# Patient Record
Sex: Male | Born: 1985 | Race: Black or African American | Hispanic: No | Marital: Single | Smoking: Never smoker
Health system: Southern US, Community
[De-identification: ages and names within clinical notes are randomized; demographics above are authoritative.]

## PROBLEM LIST (undated history)

## (undated) HISTORY — PX: OTHER SURGICAL HISTORY: SHX169

---
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
## 2007-09-05 ENCOUNTER — Emergency Department (HOSPITAL_COMMUNITY): Admission: EM | Admit: 2007-09-05 | Discharge: 2007-09-05 | Payer: Self-pay | Admitting: Emergency Medicine

## 2007-09-17 ENCOUNTER — Emergency Department (HOSPITAL_COMMUNITY): Admission: EM | Admit: 2007-09-17 | Discharge: 2007-09-17 | Payer: Self-pay | Admitting: Emergency Medicine

## 2007-09-25 ENCOUNTER — Emergency Department (HOSPITAL_COMMUNITY): Admission: EM | Admit: 2007-09-25 | Discharge: 2007-09-25 | Payer: Self-pay | Admitting: Emergency Medicine

## 2008-03-30 ENCOUNTER — Emergency Department: Payer: Self-pay | Admitting: Emergency Medicine

## 2010-12-02 ENCOUNTER — Emergency Department (HOSPITAL_COMMUNITY)
Admission: EM | Admit: 2010-12-02 | Discharge: 2010-12-02 | Disposition: A | Discharge status: Home / Self Care | Payer: Managed Care, Other (non HMO) | Attending: Emergency Medicine | Admitting: Emergency Medicine

## 2010-12-02 ENCOUNTER — Emergency Department (HOSPITAL_COMMUNITY): Payer: Managed Care, Other (non HMO)

## 2010-12-02 DIAGNOSIS — Y9239 Other specified sports and athletic area as the place of occurrence of the external cause: Secondary | ICD-10-CM | POA: Insufficient documentation

## 2010-12-02 DIAGNOSIS — W1809XA Striking against other object with subsequent fall, initial encounter: Secondary | ICD-10-CM | POA: Insufficient documentation

## 2010-12-02 DIAGNOSIS — Y9367 Activity, basketball: Secondary | ICD-10-CM | POA: Insufficient documentation

## 2010-12-02 DIAGNOSIS — S63509A Unspecified sprain of unspecified wrist, initial encounter: Secondary | ICD-10-CM | POA: Insufficient documentation

## 2012-07-03 IMAGING — CR DG WRIST COMPLETE 3+V*R*
4 series · 4 of 4 positions shown · non-contrast
Comparison: None.

CLINICAL DATA: Trauma.  Fall.  Pain.  Injured right wrist while
playing basketball 2 weeks ago.

RIGHT WRIST - COMPLETE 3+ VIEW

[x wrist pa right]
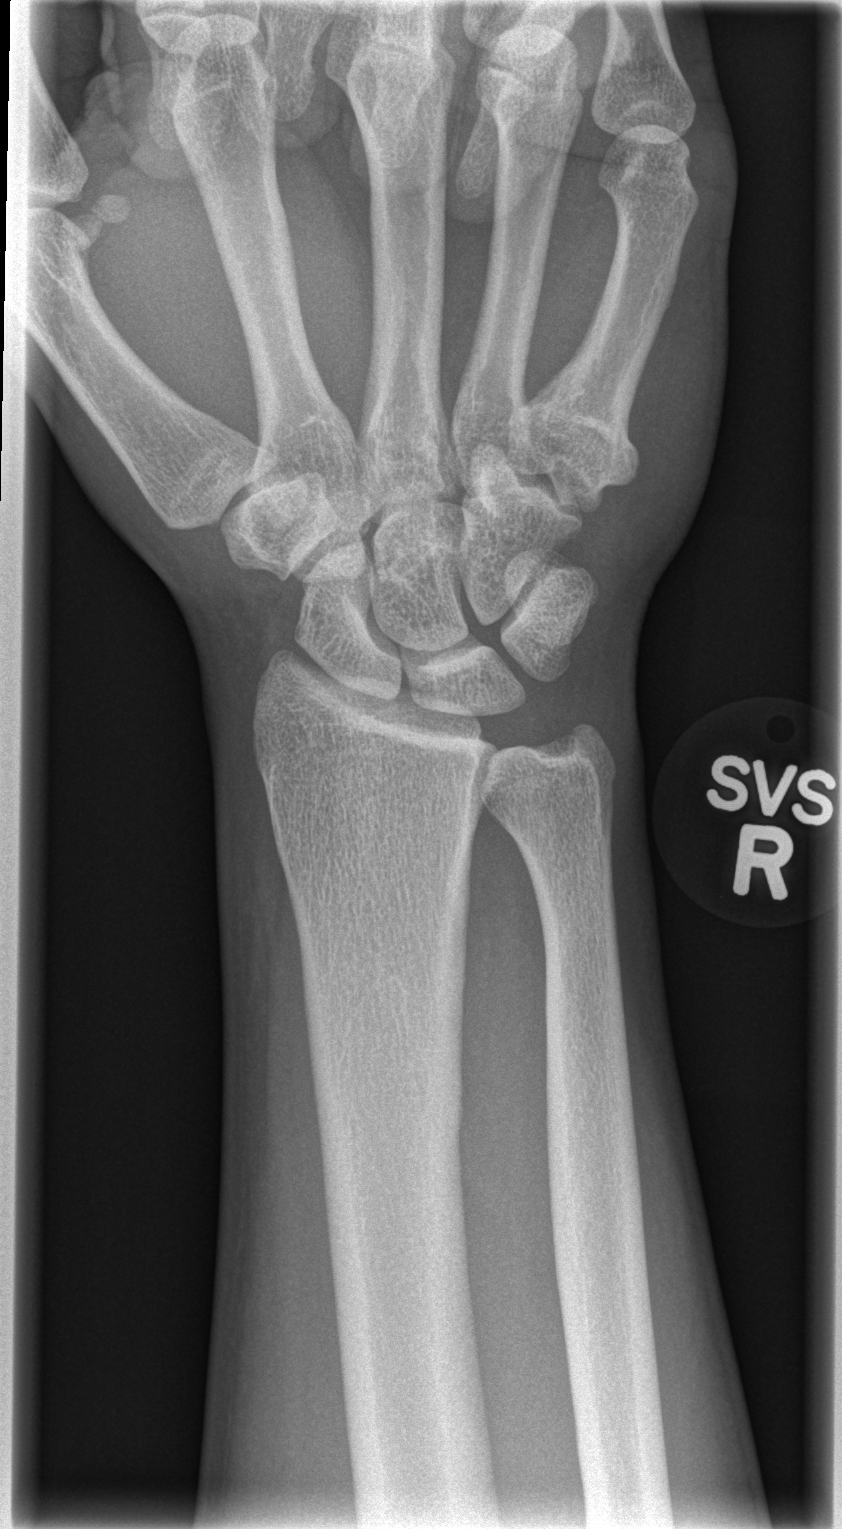

[x wrist obl right]
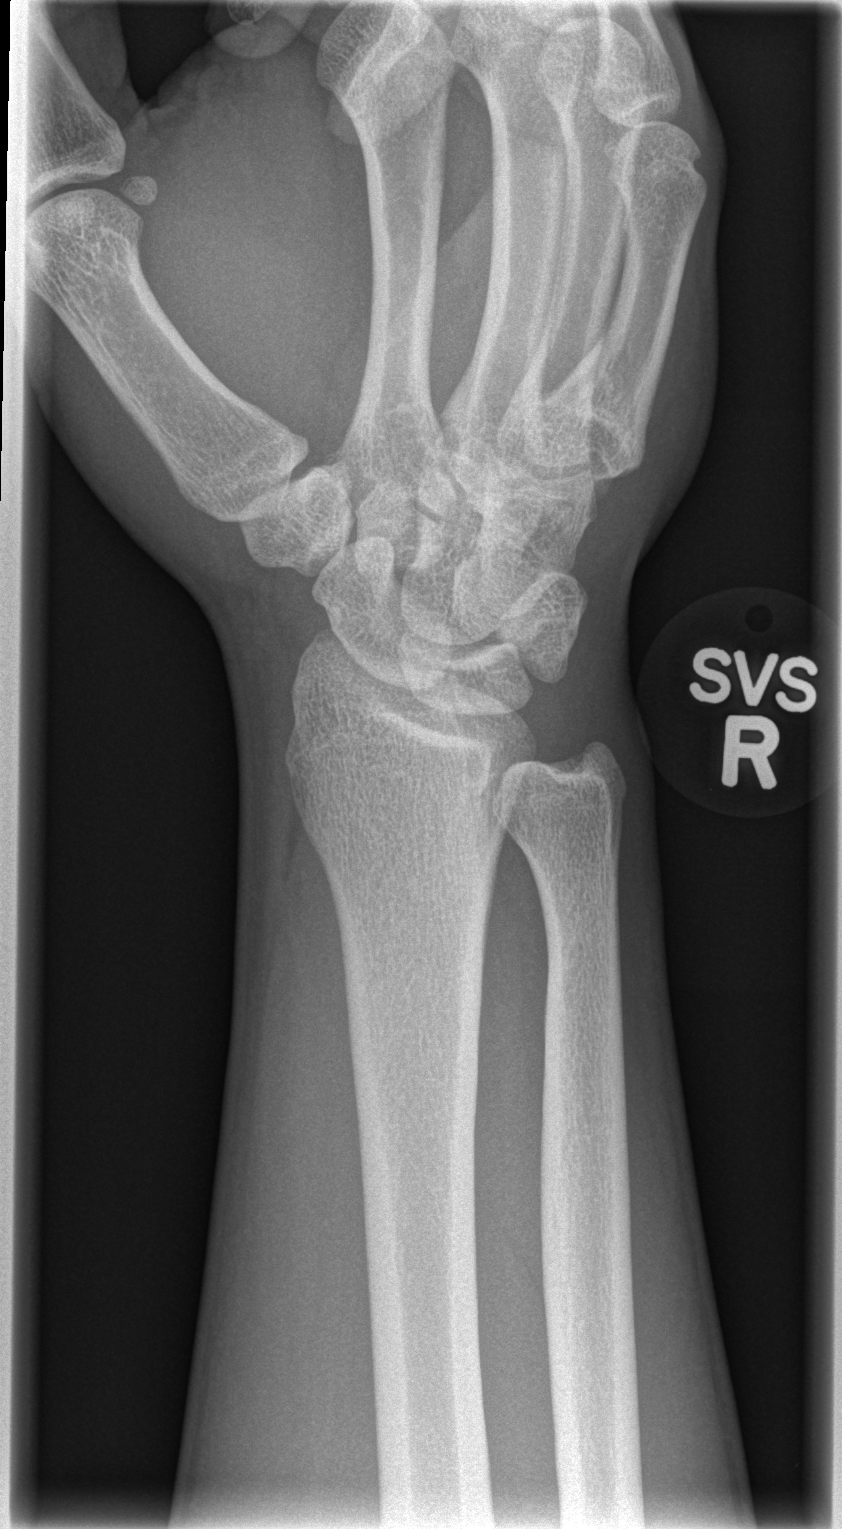

[x wrist lat right]
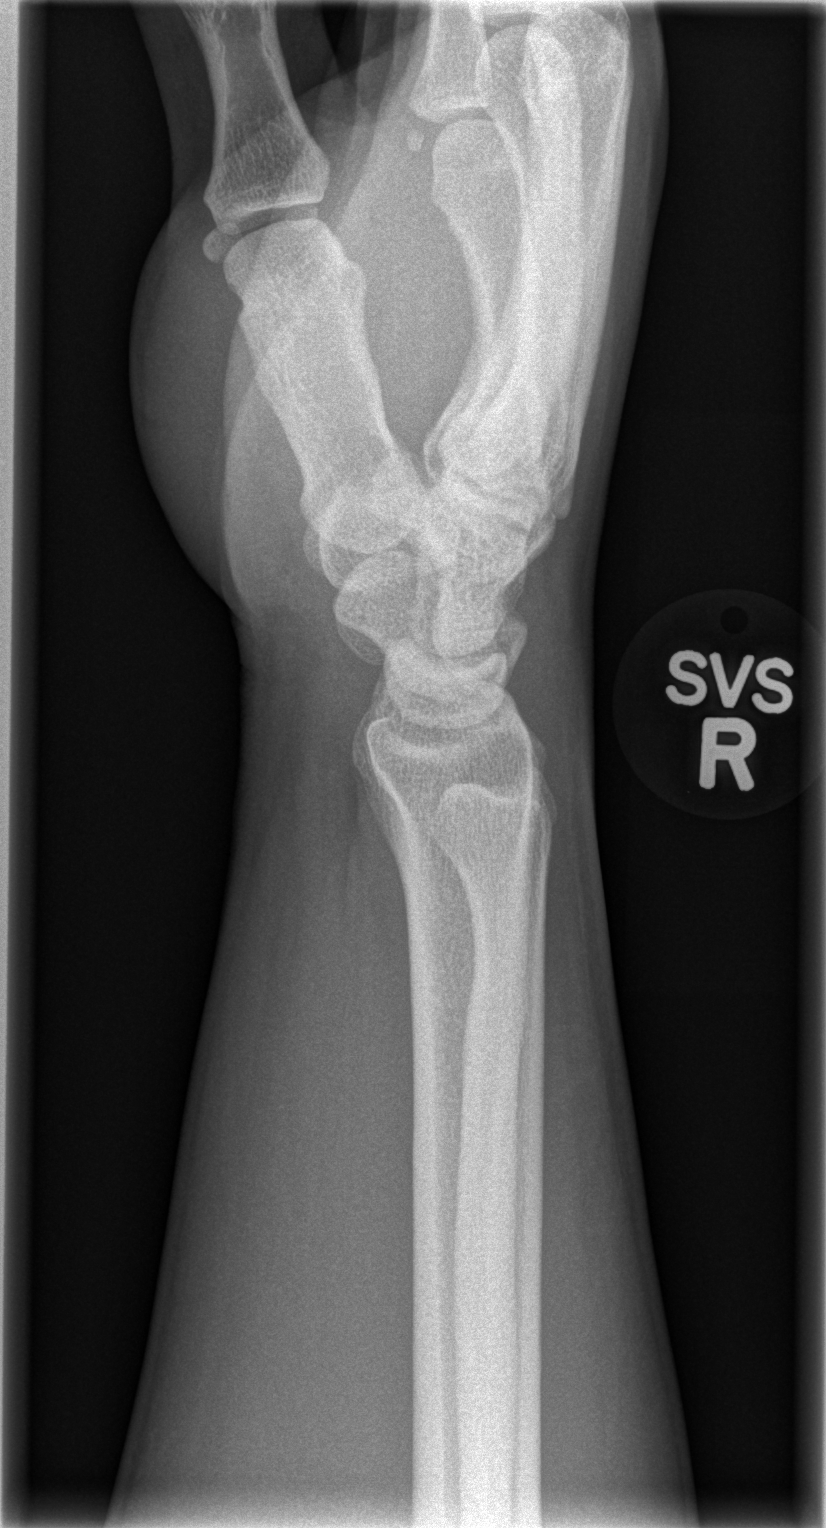

[x wrist navicular]
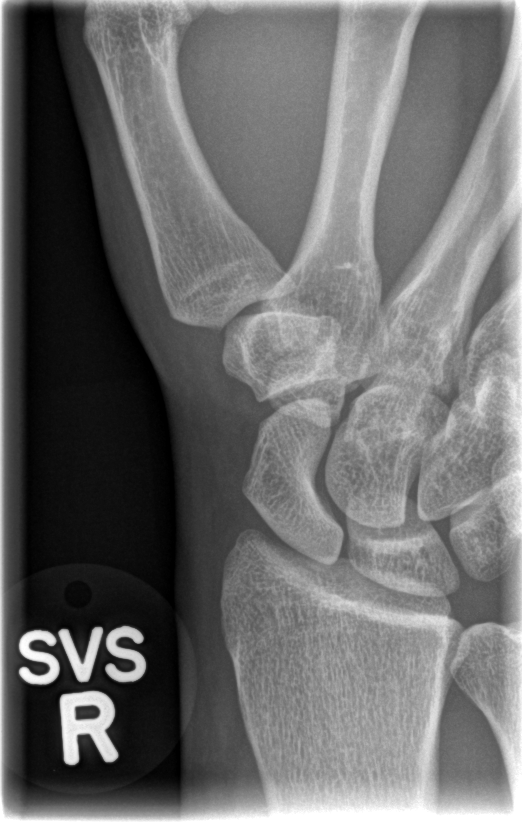

[4 of 4 positions shown; findings below may reference images not displayed]

FINDINGS: Normal mineralization.  Carpal rows are aligned.  No
acute or healing fracture is identified.  No focal soft tissue
swelling is seen.
IMPRESSION: No acute bony abnormality identified.

## 2012-12-30 ENCOUNTER — Encounter (HOSPITAL_COMMUNITY): Payer: Self-pay

## 2012-12-30 ENCOUNTER — Emergency Department (HOSPITAL_COMMUNITY)
Admission: EM | Admit: 2012-12-30 | Discharge: 2012-12-30 | Disposition: A | Discharge status: Home / Self Care | Payer: Managed Care, Other (non HMO) | Attending: Emergency Medicine | Admitting: Emergency Medicine

## 2012-12-30 DIAGNOSIS — L259 Unspecified contact dermatitis, unspecified cause: Secondary | ICD-10-CM | POA: Insufficient documentation

## 2012-12-30 DIAGNOSIS — L299 Pruritus, unspecified: Secondary | ICD-10-CM | POA: Insufficient documentation

## 2012-12-30 MED ORDER — DEXAMETHASONE SODIUM PHOSPHATE 10 MG/ML IJ SOLN
10.0000 mg | Freq: Once | INTRAMUSCULAR | Status: AC
  Administered 2012-12-30: 10 mg via INTRAVENOUS
  Filled 2012-12-30: qty 1

## 2012-12-30 MED ORDER — HYDROCORTISONE 1 % EX CREA: TOPICAL_CREAM | Freq: Two times a day (BID) | CUTANEOUS | Status: DC

## 2012-12-30 NOTE — ED Provider Notes (Signed)
History  This chart was scribed for non-physician practitioner working with Toy Baker, MD by Greggory Stallion, ED scribe. This patient was seen in room WTR5/WTR5 and the patient's care was started at 7:34 PM.  CSN: [id number]  Arrival date & time 12/30/12  1913    Chief Complaint  Patient presents with  . Rash    The history is provided by the patient. No language interpreter was used.    HPI Comments: Jack Richmond is a 27 y.o. male who presents to the Emergency Department complaining of a rash on the left arm and stomach. He states the rash is itchy. He has been working in the yard. [place] and does not notice any tick bites. He states that he is not tried anything for the itching. Pt denies fever, neck pain, sore throat, visual disturbance, CP, cough, SOB, abdominal pain, nausea, emesis, diarrhea, urinary symptoms, back pain, HA, weakness, and numbness as associated symptoms.    No past medical history on file.  No past surgical history on file.  No family history on file.  History  Substance Use Topics  . Smoking status: Not on file  . Smokeless tobacco: Not on file  . Alcohol Use: Not on file      Review of Systems  A complete 10 system review of systems was obtained and all systems are negative except as noted in the HPI and PMH.   Allergies  Review of patient's allergies indicates not on file.  Home Medications  No current outpatient prescriptions on file.  BP 138/80  Pulse 89  Temp(Src) 97.8 F (36.6 C) (Oral)  Resp 20  Ht 5\' 11"  (1.803 m)  Wt 205 lb (92.987 kg)  BMI 28.6 kg/m2  SpO2 97%  Physical Exam  Nursing note and vitals reviewed. Constitutional: He is oriented to person, place, and time. He appears well-developed and well-nourished. No distress.  HENT:  Head: Normocephalic and atraumatic.  Eyes: EOM are normal.  Neck: Neck supple. No tracheal deviation present.  Cardiovascular: Normal rate.    Pulmonary/Chest: Effort normal. No respiratory distress.  Musculoskeletal: Normal range of motion.  Neurological: He is alert and oriented to person, place, and time.  Skin: Skin is warm and dry.  Linear vesicular rash to the right arm also with scattered maculopapular lesions, characteristic of poison ivy.   Psychiatric: He has a normal mood and affect. His behavior is normal.    ED Course  Procedures (including critical care time)  DIAGNOSTIC STUDIES: Oxygen Saturation is 97% on RA, normal by my interpretation.    COORDINATION OF CARE: 7:43 PM-Discussed treatment plan with pt at bedside and pt agreed to plan.   Labs Reviewed - No data to display No results found.   1. Contact dermatitis       MDM  Patient with suspected poison ivy. Will treat with Decadron and hydrocortisone cream.  Patient is stable and ready for discharge.      I personally performed the services described in this documentation, which was scribed in my presence. The recorded information has been reviewed and is accurate.    Roxy Horseman, PA-C 12/31/12 0005

## 2012-12-30 NOTE — ED Notes (Signed)
Pt has a rash on his left arm and some on his trunk since Monday, has been out in the yard some

## 2013-01-02 NOTE — ED Provider Notes (Signed)
Medical screening examination/treatment/procedure(s) were performed by non-physician practitioner and as supervising physician I was immediately available for consultation/collaboration.  Alasia Enge T Tamelia Michalowski, MD 01/02/13 0955 

## 2022-03-07 ENCOUNTER — Ambulatory Visit: Payer: Self-pay | Admitting: Medical

## 2022-04-04 ENCOUNTER — Ambulatory Visit (INDEPENDENT_AMBULATORY_CARE_PROVIDER_SITE_OTHER): Payer: BC Managed Care – PPO | Admitting: Medical

## 2022-04-04 ENCOUNTER — Encounter: Payer: Self-pay | Admitting: Medical

## 2022-04-04 VITALS — BP 133/80 | HR 75 | Temp 98.2°F | Resp 18 | Ht 71.0 in | Wt 208.0 lb

## 2022-04-04 DIAGNOSIS — Z0000 Encounter for general adult medical examination without abnormal findings: Secondary | ICD-10-CM | POA: Diagnosis not present

## 2022-04-04 NOTE — Patient Instructions (Addendum)
For you wellness exam today I have ordered cbc, cmp and lipid panel.  Declines flu vaccine  Recommend exercise and healthy diet.  We will let you know lab results as they come in.  Follow up date appointment will be determined after lab review.    Preventive Care 21-35 Years Old, Male Preventive care refers to lifestyle choices and visits with your health care provider that can promote health and wellness. Preventive care visits are also called wellness exams. What can I expect for my preventive care visit? Counseling During your preventive care visit, your health care provider may ask about your: Medical history, including: Past medical problems. Family medical history. Current health, including: Emotional well-being. Home life and relationship well-being. Sexual activity. Lifestyle, including: Alcohol, nicotine or tobacco, and drug use. Access to firearms. Diet, exercise, and sleep habits. Safety issues such as seatbelt and bike helmet use. Sunscreen use. Work and work environment. Physical exam Your health care provider may check your: Height and weight. These may be used to calculate your BMI (body mass index). BMI is a measurement that tells if you are at a healthy weight. Waist circumference. This measures the distance around your waistline. This measurement also tells if you are at a healthy weight and may help predict your risk of certain diseases, such as type 2 diabetes and high blood pressure. Heart rate and blood pressure. Body temperature. Skin for abnormal spots. What immunizations do I need?  Vaccines are usually given at various ages, according to a schedule. Your health care provider will recommend vaccines for you based on your age, medical history, and lifestyle or other factors, such as travel or where you work. What tests do I need? Screening Your health care provider may recommend screening tests for certain conditions. This may include: Lipid and  cholesterol levels. Diabetes screening. This is done by checking your blood sugar (glucose) after you have not eaten for a while (fasting). Hepatitis B test. Hepatitis C test. HIV (human immunodeficiency virus) test. STI (sexually transmitted infection) testing, if you are at risk. Talk with your health care provider about your test results, treatment options, and if necessary, the need for more tests. Follow these instructions at home: Eating and drinking  Eat a healthy diet that includes fresh fruits and vegetables, whole grains, lean protein, and low-fat dairy products. Drink enough fluid to keep your urine pale yellow. Take vitamin and mineral supplements as recommended by your health care provider. Do not drink alcohol if your health care provider tells you not to drink. If you drink alcohol: Limit how much you have to 0-2 drinks a day. Know how much alcohol is in your drink. In the [place] one drink equals one 12 oz bottle of beer (355 mL), one 5 oz glass of wine (148 mL), or one 1 oz glass of hard liquor (44 mL). Lifestyle Brush your teeth every morning and night with fluoride toothpaste. Floss one time each day. Exercise for at least 30 minutes 5 or more days each week. Do not use any products that contain nicotine or tobacco. These products include cigarettes, chewing tobacco, and vaping devices, such as e-cigarettes. If you need help quitting, ask your health care provider. Do not use drugs. If you are sexually active, practice safe sex. Use a condom or other form of protection to prevent STIs. Find healthy ways to manage stress, such as: Meditation, yoga, or listening to music. Journaling. Talking to a trusted person. Spending time with friends and family. Minimize exposure   to UV radiation to reduce your risk of skin cancer. Safety Always wear your seat belt while driving or riding in a vehicle. Do not drive: If you have been drinking alcohol. Do not ride with someone who  has been drinking. If you have been using any mind-altering substances or drugs. While texting. When you are tired or distracted. Wear a helmet and other protective equipment during sports activities. If you have firearms in your house, make sure you follow all gun safety procedures. Seek help if you have been physically or sexually abused. What's next? Go to your health care provider once a year for an annual wellness visit. Ask your health care provider how often you should have your eyes and teeth checked. Stay up to date on all vaccines. This information is not intended to replace advice given to you by your health care provider. Make sure you discuss any questions you have with your health care provider. Document Revised: 01/16/2021 Document Reviewed: 01/16/2021 Elsevier Patient Education  2023 [employer]  

## 2022-04-04 NOTE — Progress Notes (Signed)
   Subjective:    Patient ID: Jack Richmond, male    DOB: August 16, 1985, 36 y.o.   MRN: [id number]  HPI   Pt in for first time. Will go ahead and do wellness exam. Pt is fasting.  Pt declines flu vaccine.  Pt works in [employer]. He walks a lot at home. 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 Pt states he eats healthy. Avoid caffeine beverage. Some sprite. 1.5 cup of sweet tea a day. Does drink 2-3 gatorades. Beer about 5 beers on weekend. Non smoker.   Pt states 1.5 months ago had severe dry cough and hoarse voice. Cough was for about 1.5 weeks. Then had severe laryngitis/hoarse voice type for a week. Then symptoms resolved. No heart burn around that time.    Review of Systems  Constitutional:  Negative for chills and fatigue.  HENT:  Negative for congestion, ear discharge and ear pain.   Respiratory:  Negative for choking, chest tightness, shortness of breath and wheezing.   Cardiovascular:  Negative for chest pain and palpitations.  Gastrointestinal:  Negative for abdominal pain, blood in stool, diarrhea, nausea and vomiting.  Genitourinary:  Negative for dysuria, flank pain and frequency.  Musculoskeletal:  Negative for back pain, myalgias and neck pain.  Skin:  Negative for rash.  Neurological:  Negative for seizures, facial asymmetry, speech difficulty, weakness, light-headedness and numbness.  Hematological:  Negative for adenopathy. Does not bruise/bleed easily.  Psychiatric/Behavioral:  Negative for behavioral problems, confusion, decreased concentration and dysphoric mood.         Objective:   Physical Exam  General Mental Status- Alert. General Appearance- Not in acute distress.   Skin General: Color- Normal Color. Moisture- Normal Moisture.  Neck Carotid Arteries- Normal color. Moisture- Normal Moisture. No carotid bruits. No JVD.  Chest and Lung Exam Auscultation: Breath Sounds:-Normal.  Cardiovascular Auscultation:Rythm- Regular. Murmurs & Other Heart  Sounds:Auscultation of the heart reveals- No Murmurs.  Abdomen Inspection:-Inspeection Normal. Palpation/Percussion:Note:No mass. Palpation and Percussion of the abdomen reveal- Non Tender, Non Distended + BS, no rebound or guarding.   Neurologic Cranial Nerve exam:- CN III-XII intact(No nystagmus), symmetric smile. Strength:- 5/5 equal and symmetric strength both upper and lower extremities.       Assessment & Plan:   Patient Instructions  For you wellness exam today I have ordered cbc, cmp and  lipid panel.  Declines flu vaccine.  Recommend exercise and healthy diet.  We will let you know lab results as they come in.  Follow up date appointment will be determined after lab review.          Esperanza Richters, PA-C   Discussed cough and laryngitis type event. If reoccurs advise being seen for evaluation.
